# Patient Record
Sex: Female | Born: 1965 | Race: White | Hispanic: No | Marital: Married | Smoking: Never smoker
Health system: Southern US, Community
[De-identification: ages and names within clinical notes are randomized; demographics above are authoritative.]

## PROBLEM LIST (undated history)

## (undated) DIAGNOSIS — C801 Malignant (primary) neoplasm, unspecified: Secondary | ICD-10-CM

## (undated) DIAGNOSIS — R011 Cardiac murmur, unspecified: Secondary | ICD-10-CM

## (undated) DIAGNOSIS — C4490 Unspecified malignant neoplasm of skin, unspecified: Secondary | ICD-10-CM

## (undated) HISTORY — DX: Cardiac murmur, unspecified: R01.1

## (undated) HISTORY — PX: ABDOMINAL SURGERY: SHX537

## (undated) HISTORY — DX: Malignant (primary) neoplasm, unspecified: C80.1

## (undated) HISTORY — PX: COSMETIC SURGERY: SHX468

## (undated) HISTORY — DX: Unspecified malignant neoplasm of skin, unspecified: C44.90

## (undated) HISTORY — PX: ROUX-EN-Y GASTRIC BYPASS: SHX1104

## (undated) HISTORY — PX: BREAST SURGERY: SHX581

---

## 2005-01-26 ENCOUNTER — Ambulatory Visit: Payer: Self-pay | Admitting: Family Medicine

## 2005-02-08 ENCOUNTER — Ambulatory Visit: Payer: Self-pay | Admitting: Family Medicine

## 2005-02-15 ENCOUNTER — Ambulatory Visit: Payer: Self-pay | Admitting: Family Medicine

## 2005-02-16 ENCOUNTER — Ambulatory Visit: Payer: Self-pay | Admitting: Family Medicine

## 2005-02-17 ENCOUNTER — Ambulatory Visit: Payer: Self-pay | Admitting: Family Medicine

## 2005-03-10 ENCOUNTER — Ambulatory Visit: Payer: Self-pay | Admitting: Family Medicine

## 2005-04-10 ENCOUNTER — Ambulatory Visit: Payer: Self-pay | Admitting: Family Medicine

## 2005-05-11 ENCOUNTER — Ambulatory Visit: Payer: Self-pay | Admitting: Family Medicine

## 2012-02-19 ENCOUNTER — Other Ambulatory Visit: Payer: Self-pay | Admitting: Physician Assistant

## 2012-02-19 LAB — CBC WITH DIFFERENTIAL/PLATELET
Basophil #: 0.1 10*3/uL (ref 0.0–0.1)
Lymphocyte #: 1.6 10*3/uL (ref 1.0–3.6)
MCH: 25.3 pg — ABNORMAL LOW (ref 26.0–34.0)
MCHC: 32.4 g/dL (ref 32.0–36.0)
MCV: 78 fL — ABNORMAL LOW (ref 80–100)
Monocyte #: 0.6 x10 3/mm (ref 0.2–0.9)
Neutrophil %: 63.6 %
Platelet: 233 10*3/uL (ref 150–440)
RDW: 15.4 % — ABNORMAL HIGH (ref 11.5–14.5)

## 2012-10-25 ENCOUNTER — Ambulatory Visit: Payer: Self-pay | Admitting: General Practice

## 2013-08-18 ENCOUNTER — Ambulatory Visit: Payer: Self-pay | Admitting: General Practice

## 2016-08-16 DIAGNOSIS — Z85828 Personal history of other malignant neoplasm of skin: Secondary | ICD-10-CM | POA: Insufficient documentation

## 2018-03-31 ENCOUNTER — Emergency Department: Payer: Self-pay

## 2018-03-31 ENCOUNTER — Encounter: Payer: Self-pay | Admitting: *Deleted

## 2018-03-31 ENCOUNTER — Emergency Department
Admission: EM | Admit: 2018-03-31 | Discharge: 2018-03-31 | Disposition: A | Discharge status: Home / Self Care | Payer: Self-pay | Attending: Student in an Organized Health Care Education/Training Program | Admitting: Student in an Organized Health Care Education/Training Program

## 2018-03-31 ENCOUNTER — Other Ambulatory Visit: Payer: Self-pay

## 2018-03-31 DIAGNOSIS — G5602 Carpal tunnel syndrome, left upper limb: Secondary | ICD-10-CM | POA: Insufficient documentation

## 2018-03-31 DIAGNOSIS — Z79899 Other long term (current) drug therapy: Secondary | ICD-10-CM | POA: Insufficient documentation

## 2018-03-31 MED ORDER — TRIAMCINOLONE ACETONIDE 40 MG/ML IJ SUSP
10.0000 mg | Freq: Once | INTRAMUSCULAR | Status: AC
  Administered 2018-03-31: 10 mg via INTRA_ARTICULAR
  Filled 2018-03-31: qty 0.25

## 2018-03-31 MED ORDER — NAPROXEN 500 MG PO TABS
500.0000 mg | ORAL_TABLET | Freq: Once | ORAL | Status: AC
  Administered 2018-03-31: 500 mg via ORAL
  Filled 2018-03-31: qty 1

## 2018-03-31 MED ORDER — BUPIVACAINE HCL (PF) 0.5 % IJ SOLN
30.0000 mL | Freq: Once | INTRAMUSCULAR | Status: DC
  Filled 2018-03-31: qty 30

## 2018-03-31 MED ORDER — PREDNISONE 20 MG PO TABS
60.0000 mg | ORAL_TABLET | Freq: Once | ORAL | Status: AC
  Administered 2018-03-31: 60 mg via ORAL
  Filled 2018-03-31: qty 3

## 2018-03-31 MED ORDER — LIDOCAINE HCL (PF) 1 % IJ SOLN
5.0000 mL | Freq: Once | INTRAMUSCULAR | Status: AC
  Administered 2018-03-31: 5 mL via INTRADERMAL
  Filled 2018-03-31: qty 5

## 2018-03-31 MED ORDER — NAPROXEN 500 MG PO TABS: 500.0000 mg | ORAL_TABLET | Freq: Two times a day (BID) | ORAL | 0 refills | Status: AC

## 2018-03-31 MED ORDER — HYDROCODONE-ACETAMINOPHEN 5-325 MG PO TABS: 1.0000 | ORAL_TABLET | ORAL | 0 refills | Status: AC | PRN

## 2018-03-31 NOTE — ED Notes (Signed)
Patient c/o burning/numbness/sharp pain and weakness in left hand beginning Thursday. Patient went to urgent care Friday for same, dx with gout, prescribed colchicine - had little to no relief of symptoms.

## 2018-03-31 NOTE — ED Triage Notes (Signed)
Pt has weaker L grip and her L hand is cooler to the touch.

## 2018-03-31 NOTE — ED Provider Notes (Signed)
[facility] Emergency Department Provider Note    First MD Initiated Contact with Patient 03/31/18 [phone]     (approximate)  I have reviewed the triage vital signs and the nursing notes.   HISTORY  Chief Complaint Hand Pain    HPI Angela Contreras is a 52 y.o. female presents the ER for evaluation of several days of pins and needle sensation to the left hand.  Denies any injury.  Works at [employer].  Was recently seen by urgent care and was told that it was gout and placed on colchicine however the patient is not having any improvement in symptoms.  States that she has trouble feeling her fingers and that they feel cold.  States it is worse on the volar aspect of her hand.  She is right-hand dominant.  Denies any neck pain chest pain or upper arm pain.    History reviewed. No pertinent past medical history. History reviewed. No pertinent family history. Past Surgical History:  Procedure Laterality Date  . ABDOMINAL SURGERY    . ROUX-EN-Y GASTRIC BYPASS     There are no active problems to display for this patient.     Prior to Admission medications   Medication Sig Start Date End Date Taking? Authorizing Provider  ferrous sulfate 325 (65 FE) MG EC tablet Take 325 mg by mouth 3 (three) times daily with meals.   Yes [provider]  MULTIPLE VITAMIN PO Take by mouth.   Yes [provider]  vitamin B-12 (CYANOCOBALAMIN) 100 MCG tablet Take 100 mcg by mouth daily.   Yes [provider]  HYDROcodone-acetaminophen (NORCO) 5-325 MG tablet Take 1 tablet by mouth every 4 (four) hours as needed for moderate pain. 03/31/18   Merlyn Lot, MD  naproxen (NAPROSYN) 500 MG tablet Take 1 tablet (500 mg total) by mouth 2 (two) times daily with a meal. 03/31/18 03/31/19  Merlyn Lot, MD    Allergies Patient has no known allergies.    Social History Social History   Tobacco Use  . Smoking status: Never Smoker  . Smokeless  tobacco: Never Used  Substance Use Topics  . Alcohol use: Not Currently  . Drug use: Never    Review of Systems Patient denies headaches, rhinorrhea, blurry vision, numbness, shortness of breath, chest pain, edema, cough, abdominal pain, nausea, vomiting, diarrhea, dysuria, fevers, rashes or hallucinations unless otherwise stated above in HPI. ____________________________________________   PHYSICAL EXAM:  VITAL SIGNS: Vitals:   03/31/18 0230 03/31/18 0615  BP: (!) 155/90 (!) 155/90  Pulse: 84 84  Resp: 17 20  Temp: 98.2 F (36.8 C)   SpO2: 100% 100%    Constitutional: Alert and oriented.  Eyes: Conjunctivae are normal.  Head: Atraumatic. Nose: No congestion/rhinnorhea. Mouth/Throat: Mucous membranes are moist.   Neck: No stridor. Painless ROM.  Cardiovascular: Normal rate, regular rhythm. Grossly normal heart sounds.  Good peripheral circulation. Respiratory: Normal respiratory effort.  No retractions. Lungs CTAB. Gastrointestinal: Soft and nontender. No distention. No abdominal bruits. No CVA tenderness. Genitourinary:  Musculoskeletal: Brisk cap refill.  Strong radial and ulnar pulse.  Able to cross fingers make okay sign and touch thumb to pinky bilateral upper extremities.  Right hand is slightly warmer to touch as compared to the left.  no lower extremity tenderness nor edema.  No joint effusions. Neurologic:  Normal speech and language. No gross focal neurologic deficits are appreciated. No facial droop Skin:  Skin is warm, dry and intact. No rash noted. Psychiatric:  Mood and affect are normal. Speech and behavior are normal.  ____________________________________________   LABS (all labs ordered are listed, but only abnormal results are displayed)  No results found for this or any previous visit (from the past 24 hour(s)). ____________________________________________  EKG My review and personal interpretation at Time: 4:32   Indication: wrist pain  Rate: 75   Rhythm: sinus Axis: normal Other: normal intervals, no stemi ____________________________________________  RADIOLOGY  I personally reviewed all radiographic images ordered to evaluate for the above acute complaints and reviewed radiology reports and findings.  These findings were personally discussed with the patient.  Please see medical record for radiology report.  ____________________________________________   PROCEDURES  Procedure(s) performed:  Procedures Procedure: carpal tunnel steroid injection The patient agreed to the procedure without reservation, and acknowledging the risks of infection, nerve damage, damage to ligaments and tendons,bleeding. The left volar wrist was prepped with chloraprep and allowed to dry. A 5 cc syringe was loaded with 1% lidocaine and 20mg  of kenalog and a 25 ga needle advanced toward thecarpal tunnel using anatomic land marks and [place]. The plunger was withdrawn before injection. The patient tolerated the procedure well.    Critical Care performed: no ____________________________________________   INITIAL IMPRESSION / ASSESSMENT AND PLAN / ED COURSE  Pertinent labs & imaging results that were available during my care of the patient were reviewed by me and considered in my medical decision making (see chart for details).   DDX: Carpal tunnel, peripheral neuropathy, ischemia  Angela Contreras is a 52 y.o. who presents to the ED with pain of left hand as described above.  Seems clinically consistent with carpal tunnel.  Has good peripheral circulation and pulses.  Not consistent with ischemic limb.  No abnormality on exam more proximally to the wrist.  Positive Tinel's and Phalen sign.  Will check x-ray to exclude arthritic or osteolytic changes.  Clinical Course as of Mar 31 722  Sun Mar 31, 2018  3614 After discussing risks versus benefits and consenting patient to the procedure a injection of triamcinolone with lidocaine was performed into the  carpal tunnel after extensive skin preparation with chlorhexidine and using sterile technique using ultrasound guidance with improvement in symptoms.   [PR]    Clinical Course User Index [PR] Merlyn Lot, MD     As part of my medical decision making, I reviewed the following data within the [facility] notes reviewed and incorporated, Labs reviewed, notes from prior ED visits.   ____________________________________________   FINAL CLINICAL IMPRESSION(S) / ED DIAGNOSES  Final diagnoses:  Carpal tunnel syndrome of left wrist      NEW MEDICATIONS STARTED DURING THIS VISIT:  Discharge Medication List as of 03/31/2018  6:17 AM    START taking these medications   Details  HYDROcodone-acetaminophen (NORCO) 5-325 MG tablet Take 1 tablet by mouth every 4 (four) hours as needed for moderate pain., Starting Sun 03/31/2018, Print    naproxen (NAPROSYN) 500 MG tablet Take 1 tablet (500 mg total) by mouth 2 (two) times daily with a meal., Starting Sun 03/31/2018, Until Mon 03/31/2019, Print         Note:  This document was prepared using Dragon voice recognition software and may include unintentional dictation errors.    Merlyn Lot, MD 03/31/18 [phone]

## 2018-03-31 NOTE — ED Triage Notes (Addendum)
Pt c/o pins and needles sensation to left hand; says she was seen by her provider this past week for a different reason but brought her hand pain up in discussion; was told it was probably gout and put on Colchicine; pt says now she can hardly feel her fingers; her fingers feel cold; pain to the back of her hand; denies fall/injury; no swelling observed; strong radial pulse noted

## 2018-04-17 ENCOUNTER — Encounter: Payer: Self-pay | Admitting: Family Medicine

## 2018-04-17 ENCOUNTER — Ambulatory Visit (INDEPENDENT_AMBULATORY_CARE_PROVIDER_SITE_OTHER): Payer: BLUE CROSS/BLUE SHIELD | Admitting: Family Medicine

## 2018-04-17 VITALS — BP 135/84 | HR 82 | Temp 97.9°F | Ht 62.0 in | Wt 195.0 lb

## 2018-04-17 DIAGNOSIS — M2550 Pain in unspecified joint: Secondary | ICD-10-CM

## 2018-04-17 DIAGNOSIS — Z7689 Persons encountering health services in other specified circumstances: Secondary | ICD-10-CM

## 2018-04-17 DIAGNOSIS — M0579 Rheumatoid arthritis with rheumatoid factor of multiple sites without organ or systems involvement: Secondary | ICD-10-CM | POA: Diagnosis not present

## 2018-04-17 MED ORDER — PREDNISONE 20 MG PO TABS: 40.0000 mg | ORAL_TABLET | Freq: Every day | ORAL | 0 refills | Status: AC

## 2018-04-17 NOTE — Progress Notes (Signed)
BP 135/84   Pulse 82   Temp 97.9 F (36.6 C) (Oral)   Ht 5' 2"  (1.575 m)   Wt 195 lb (88.5 kg)   SpO2 95%   BMI 35.67 kg/m    Subjective:    Patient ID: Angela Contreras, female    DOB: 29-Jan-1966, 53 y.o.   MRN: [id number]  HPI: Angela Contreras is a 53 y.o. female  Chief Complaint  Patient presents with  . New Patient (Initial Visit)    Pt believes she has rhematoid artritis, was previously told by Dr. Jefm Bryant she did have the R.A. factor, but they would just watch it  . Referral    To rheumatology  . Knee Injury    Left knee, stepped down wrong right before Christmas. Heard a "pop". Started swelling. Seen at [facility], told she had a torn ligament and given a brace  . Wrist Pain    Noticed wrist pain started after knee injury. UC said it was gout, medication did not help. Went to E.R. was told she had carpal tunnel  . Shoulder Pain    Began after the wrist pain. All pain began after a few days of each other   Here today to establish care.   Started seeing Dr. Jefm Bryant for positive RF 6 years ago and was asymptomatic since until last month. Started last month with left knee pain and sweilling. Then left wrist started to hurt and swell. Went to UC and was given a knee brace and told her wrist looked like gout. Was given colchicine which may have helped some for 24 hours but got much worse after that. Felt like her hand was being squeezed off so went to ER. Dx'd with carpal tunnel. Was referred to orthopedics and given anti inflammatories. Wearing a night brace. Much improved but still weak.   Then had right ankle pain pop up and right shoulder started acting up.  Then started having right rib pain that has since dissipated.  Now having neck stiffness and pain when she sits up first thing in the morning. This dissipates as she gets up and moving.   Had gastric bypass in 2007. Not regularly followed by GI.   Otherwise, no known medical issues.   Has not had a CPE in years.    Relevant past medical, surgical, family and social history reviewed and updated as indicated. Interim medical history since our last visit reviewed. Allergies and medications reviewed and updated.  Review of Systems  Per HPI unless specifically indicated above     Objective:    BP 135/84   Pulse 82   Temp 97.9 F (36.6 C) (Oral)   Ht 5' 2"  (1.575 m)   Wt 195 lb (88.5 kg)   SpO2 95%   BMI 35.67 kg/m   Wt Readings from Last 3 Encounters:  04/17/18 195 lb (88.5 kg)  03/31/18 205 lb (93 kg)    Physical Exam Vitals signs and nursing note reviewed.  Constitutional:      Appearance: Normal appearance. She is not ill-appearing.  HENT:     Head: Atraumatic.  Eyes:     Extraocular Movements: Extraocular movements intact.     Conjunctiva/sclera: Conjunctivae normal.  Neck:     Musculoskeletal: Normal range of motion and neck supple.  Cardiovascular:     Rate and Rhythm: Normal rate and regular rhythm.     Heart sounds: Normal heart sounds.  Pulmonary:     Effort: Pulmonary effort is normal.  Breath sounds: Normal breath sounds.  Musculoskeletal: Normal range of motion.        General: Swelling (multiple joints, minimal) present.  Skin:    General: Skin is warm and dry.  Neurological:     Mental Status: She is alert and oriented to person, place, and time.  Psychiatric:        Mood and Affect: Mood normal.        Thought Content: Thought content normal.        Judgment: Judgment normal.     Results for orders placed or performed in visit on 04/17/18  Rheumatoid Factor  Result Value Ref Range   Rhuematoid fact SerPl-aCnc 171.5 (H) 0.0 - 13.9 IU/mL  ANA w/Reflex  Result Value Ref Range   Anti Nuclear Antibody(ANA) Negative Negative  Sed Rate (ESR)  Result Value Ref Range   Sed Rate 98 (H) 0 - 40 mm/hr  CYCLIC CITRUL PEPTIDE ANTIBODY, IGG/IGA  Result Value Ref Range   Cyclic Citrullin Peptide Ab >250 (H) 0 - 19 units  C-reactive protein  Result Value Ref  Range   CRP 16 (H) 0 - 10 mg/L  CBC with Differential/Platelet  Result Value Ref Range   WBC 12.2 (H) 3.4 - 10.8 x10E3/uL   RBC 4.73 3.77 - 5.28 x10E6/uL   Hemoglobin 13.2 11.1 - 15.9 g/dL   Hematocrit 40.3 34.0 - 46.6 %   MCV 85 79 - 97 fL   MCH 27.9 26.6 - 33.0 pg   MCHC 32.8 31.5 - 35.7 g/dL   RDW 11.9 11.7 - 15.4 %   Platelets 434 150 - 450 x10E3/uL   Neutrophils 68 Not Estab. %   Lymphs 16 Not Estab. %   Monocytes 6 Not Estab. %   Eos 9 Not Estab. %   Basos 1 Not Estab. %   Neutrophils Absolute 8.4 (H) 1.4 - 7.0 x10E3/uL   Lymphocytes Absolute 1.9 0.7 - 3.1 x10E3/uL   Monocytes Absolute 0.7 0.1 - 0.9 x10E3/uL   EOS (ABSOLUTE) 1.1 (H) 0.0 - 0.4 x10E3/uL   Basophils Absolute 0.1 0.0 - 0.2 x10E3/uL   Immature Granulocytes 0 Not Estab. %   Immature Grans (Abs) 0.0 0.0 - 0.1 x10E3/uL      Assessment & Plan:   Problem List Items Addressed This Visit      Musculoskeletal and Integument   Rheumatoid arthritis involving multiple sites with positive rheumatoid factor ([place]) - Primary    Suspect all of her recent joint complaints are related to an RA flare. Start prednisone burst and refer to Rheumatology for management. Pt requesting repeat lab workup as she states it was requested by the Rheumatology office she spoke with. Anti inflammatory diet reviewed as well      Relevant Medications   predniSONE (DELTASONE) 20 MG tablet   Other Relevant Orders   Ambulatory referral to Rheumatology   Rheumatoid Factor (Completed)   ANA w/Reflex (Completed)   Sed Rate (ESR) (Completed)   CYCLIC CITRUL PEPTIDE ANTIBODY, IGG/IGA (Completed)   C-reactive protein (Completed)   CBC with Differential/Platelet (Completed)    Other Visit Diagnoses    Encounter to establish care       Arthralgia, unspecified joint       Relevant Orders   Ambulatory referral to Rheumatology   Rheumatoid Factor (Completed)   ANA w/Reflex (Completed)   Sed Rate (ESR) (Completed)   CYCLIC CITRUL PEPTIDE  ANTIBODY, IGG/IGA (Completed)   C-reactive protein (Completed)   CBC with Differential/Platelet (Completed)  Follow up plan: Return for CPE.

## 2018-04-19 LAB — CBC WITH DIFFERENTIAL/PLATELET
BASOS: 1 %
Basophils Absolute: 0.1 10*3/uL (ref 0.0–0.2)
EOS (ABSOLUTE): 1.1 10*3/uL — ABNORMAL HIGH (ref 0.0–0.4)
Eos: 9 %
HEMATOCRIT: 40.3 % (ref 34.0–46.6)
Hemoglobin: 13.2 g/dL (ref 11.1–15.9)
Immature Grans (Abs): 0 10*3/uL (ref 0.0–0.1)
Immature Granulocytes: 0 %
LYMPHS ABS: 1.9 10*3/uL (ref 0.7–3.1)
Lymphs: 16 %
MCH: 27.9 pg (ref 26.6–33.0)
MCHC: 32.8 g/dL (ref 31.5–35.7)
MCV: 85 fL (ref 79–97)
Monocytes Absolute: 0.7 10*3/uL (ref 0.1–0.9)
Monocytes: 6 %
Neutrophils Absolute: 8.4 10*3/uL — ABNORMAL HIGH (ref 1.4–7.0)
Neutrophils: 68 %
Platelets: 434 10*3/uL (ref 150–450)
RBC: 4.73 x10E6/uL (ref 3.77–5.28)
RDW: 11.9 % (ref 11.7–15.4)
WBC: 12.2 10*3/uL — ABNORMAL HIGH (ref 3.4–10.8)

## 2018-04-19 LAB — C-REACTIVE PROTEIN: CRP: 16 mg/L — ABNORMAL HIGH (ref 0–10)

## 2018-04-19 LAB — ANA W/REFLEX: ANA: NEGATIVE

## 2018-04-19 LAB — CYCLIC CITRUL PEPTIDE ANTIBODY, IGG/IGA: Cyclic Citrullin Peptide Ab: >250 units — ABNORMAL HIGH (ref 0–19)

## 2018-04-19 LAB — SEDIMENTATION RATE: Sed Rate: 98 mm/hr — ABNORMAL HIGH (ref 0–40)

## 2018-04-19 LAB — RHEUMATOID FACTOR: Rheumatoid fact SerPl-aCnc: 171.5 IU/mL — ABNORMAL HIGH (ref 0.0–13.9)

## 2018-04-20 DIAGNOSIS — M0579 Rheumatoid arthritis with rheumatoid factor of multiple sites without organ or systems involvement: Secondary | ICD-10-CM | POA: Insufficient documentation

## 2018-04-20 NOTE — Assessment & Plan Note (Signed)
Suspect all of her recent joint complaints are related to an RA flare. Start prednisone burst and refer to Rheumatology for management. Pt requesting repeat lab workup as she states it was requested by the Rheumatology office she spoke with. Anti inflammatory diet reviewed as well

## 2019-05-28 ENCOUNTER — Other Ambulatory Visit: Payer: Self-pay | Admitting: Physician Assistant

## 2019-05-28 ENCOUNTER — Other Ambulatory Visit: Payer: Self-pay

## 2019-05-28 ENCOUNTER — Ambulatory Visit
Admission: RE | Admit: 2019-05-28 | Discharge: 2019-05-28 | Disposition: A | Discharge status: Home / Self Care | Payer: BLUE CROSS/BLUE SHIELD | Source: Ambulatory Visit | Attending: Physician Assistant | Admitting: Physician Assistant

## 2019-05-28 DIAGNOSIS — R2242 Localized swelling, mass and lump, left lower limb: Secondary | ICD-10-CM

## 2020-01-15 IMAGING — DX DG WRIST COMPLETE 3+V*L*
4 series · 4 of 4 positions shown · non-contrast
Comparison: None.

CLINICAL DATA: Sharp wrist pain, diagnosed with gout 5 days ago.

EXAM:
LEFT WRIST - COMPLETE 3+ VIEW

[wrist ap (1 of 2)]
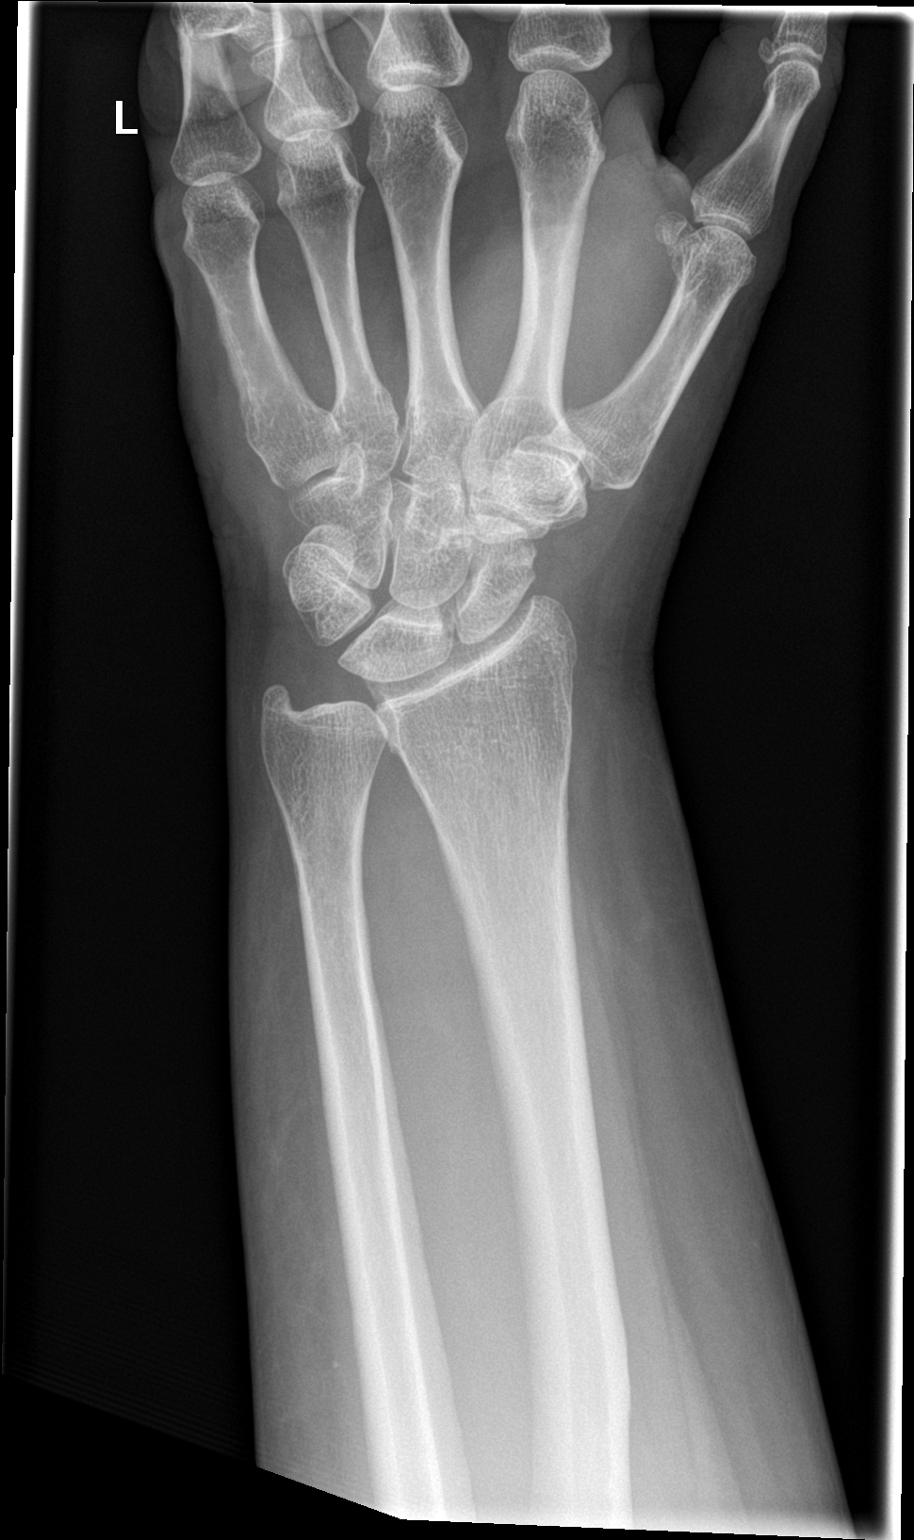

[wrist obl]
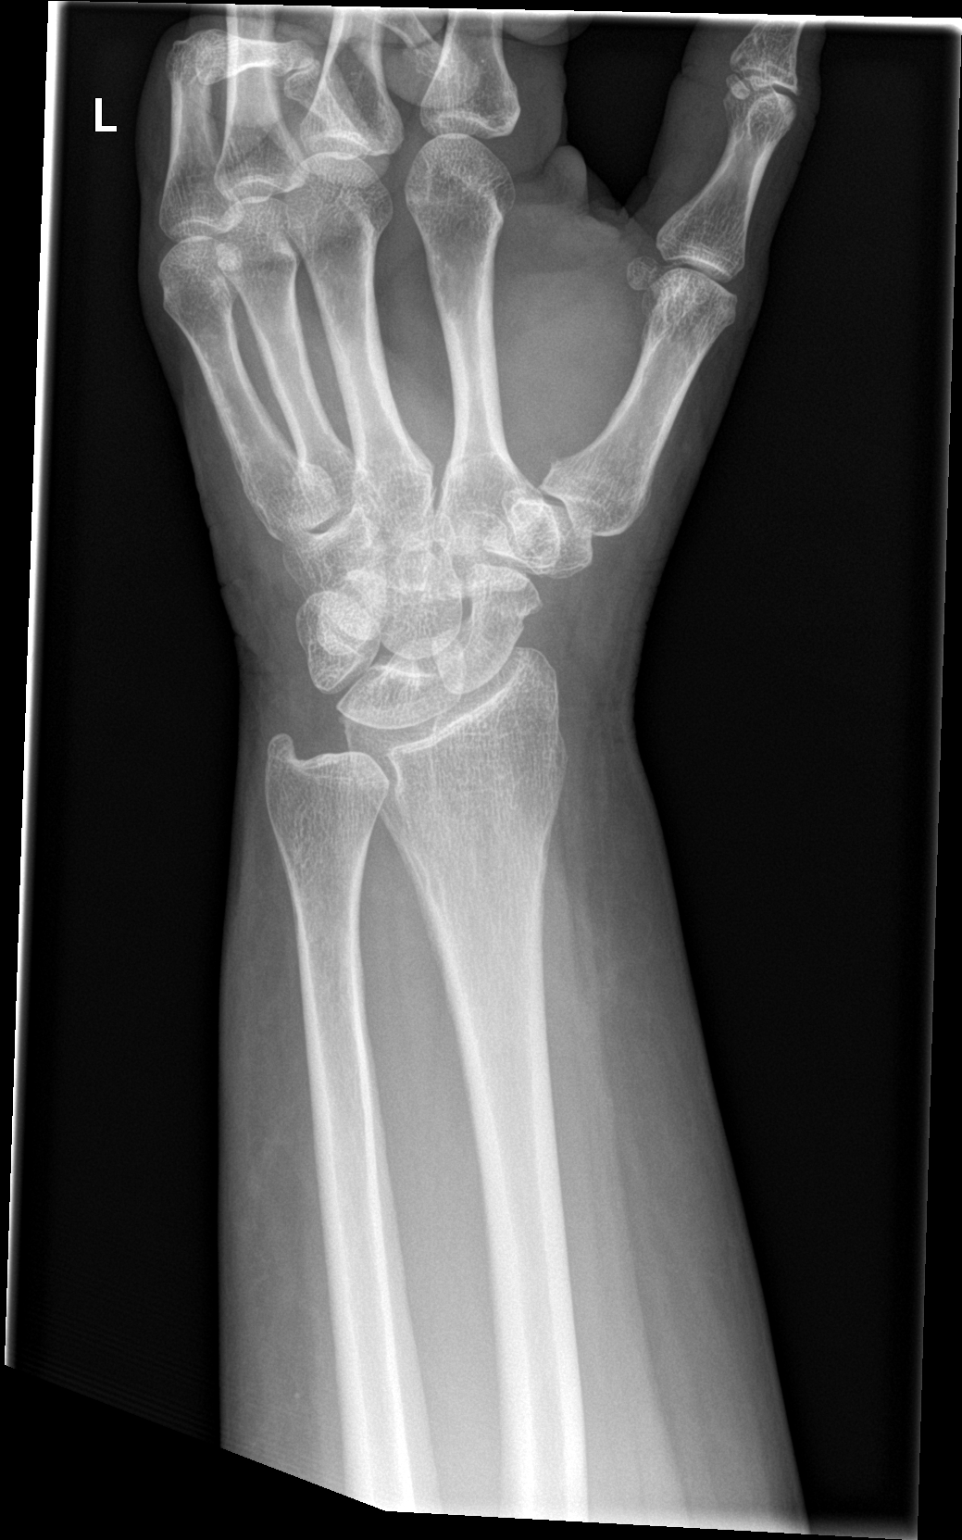

[wrist lat]
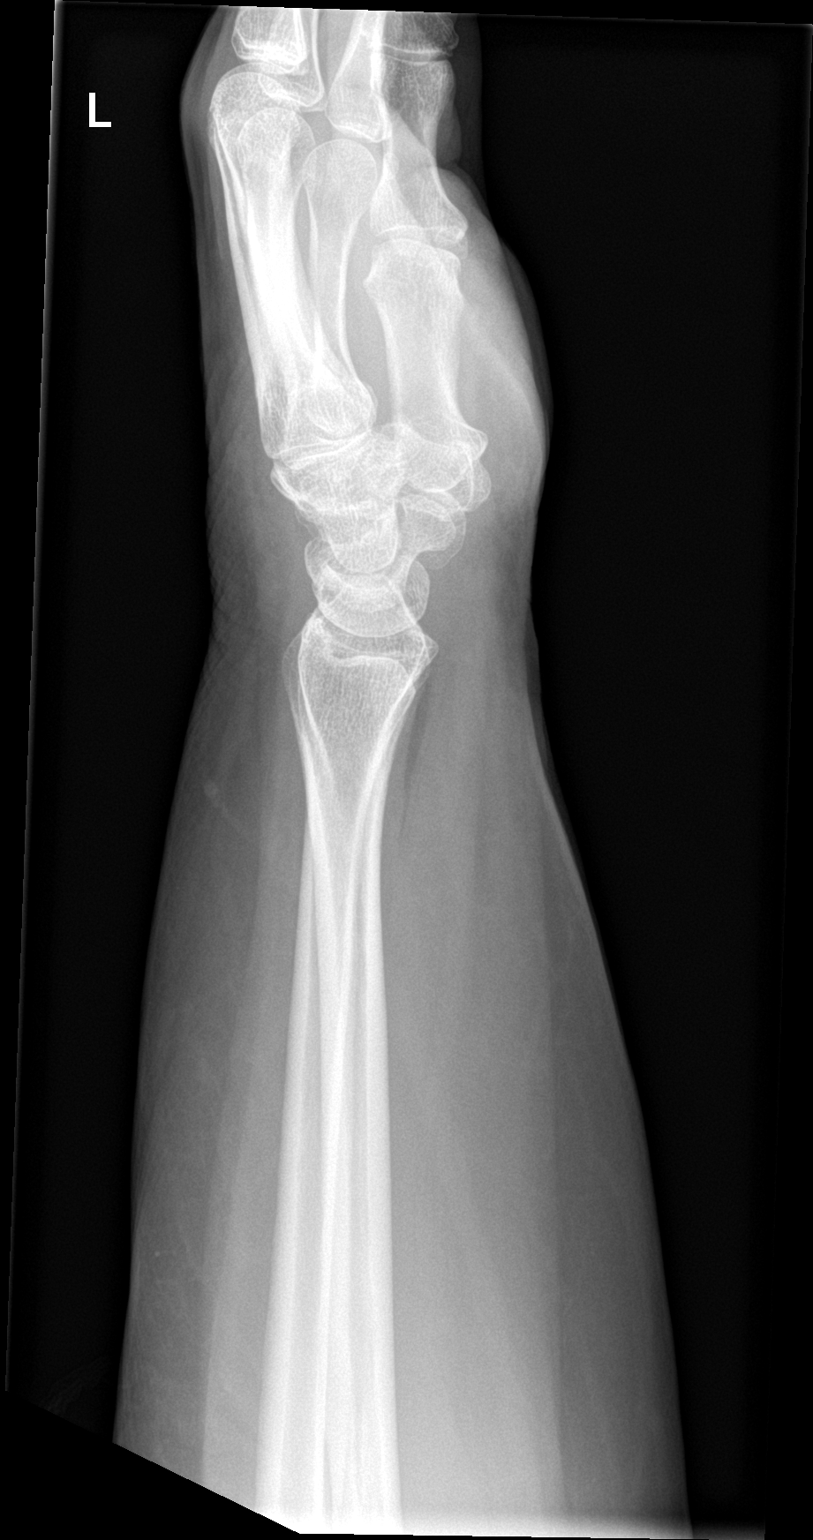

[wrist ap (2 of 2)]
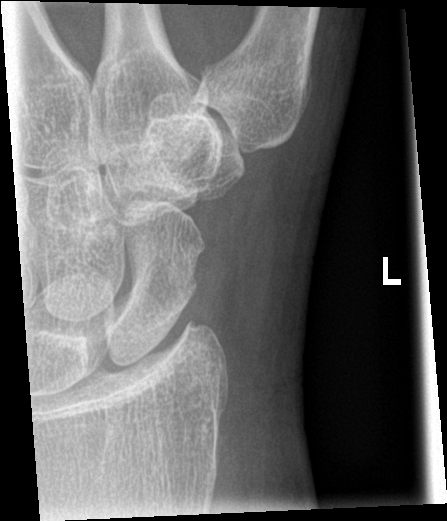

[4 of 4 positions shown; findings below may reference images not displayed]

FINDINGS: There is no evidence of fracture or dislocation. There is no
evidence of arthropathy or other focal bone abnormality. Soft
tissues are unremarkable.
IMPRESSION: Negative.

## 2021-03-13 IMAGING — US US EXTREM LOW VENOUS*L*
1 series · 14 of 24 positions shown · non-contrast
Comparison: None.

CLINICAL DATA: Left leg swelling

EXAM:
LEFT LOWER EXTREMITY VENOUS DOPPLER ULTRASOUND
TECHNIQUE: Gray-scale sonography with compression, as well as color and duplex
ultrasound, were performed to evaluate the deep venous system(s)
from the level of the common femoral vein through the popliteal and
proximal calf veins.

[Series 1: us extrem low venous*left* · 0.07mm/px · 45 acquisitions, 14 frames shown]
[im 1/45]
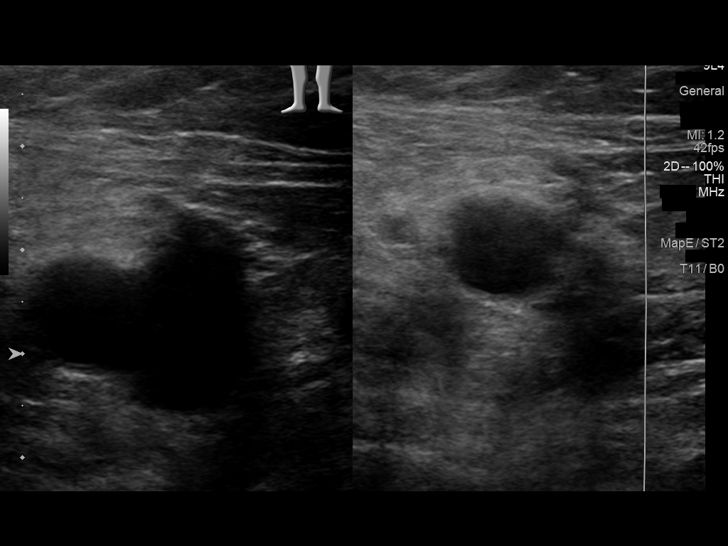
[im 4/45]
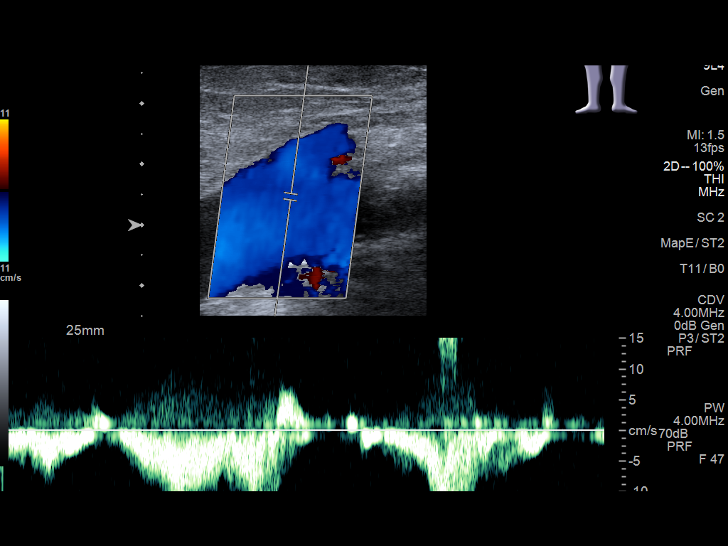
[im 8/45]
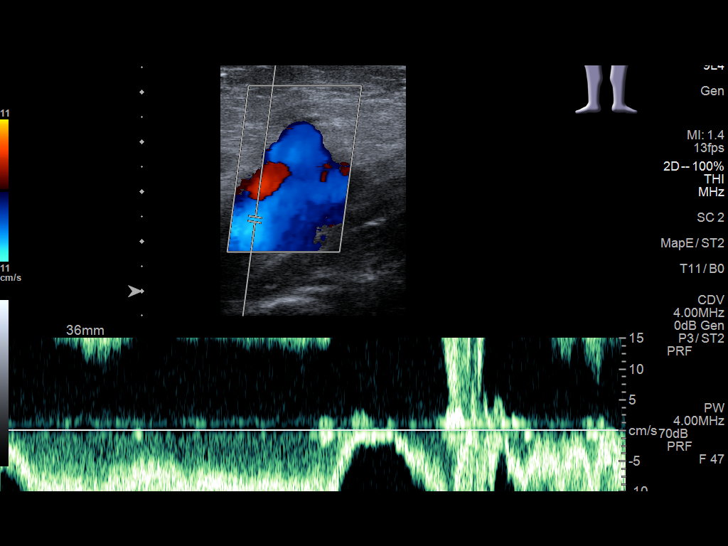
[im 12/45]
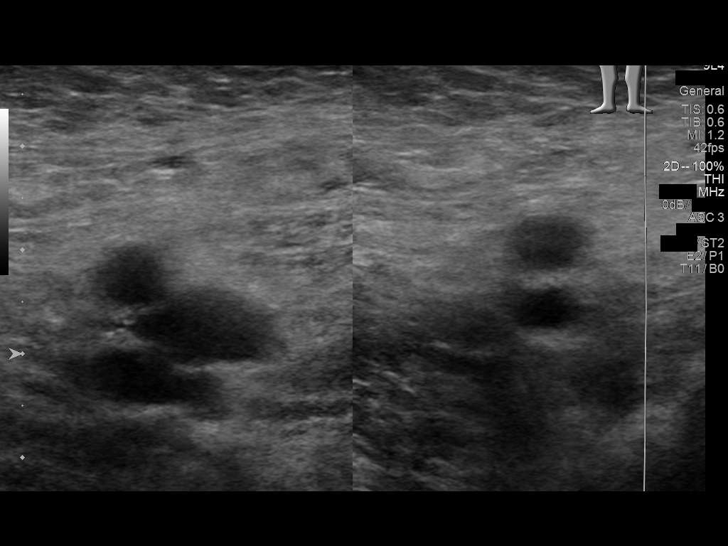
[im 14/45]
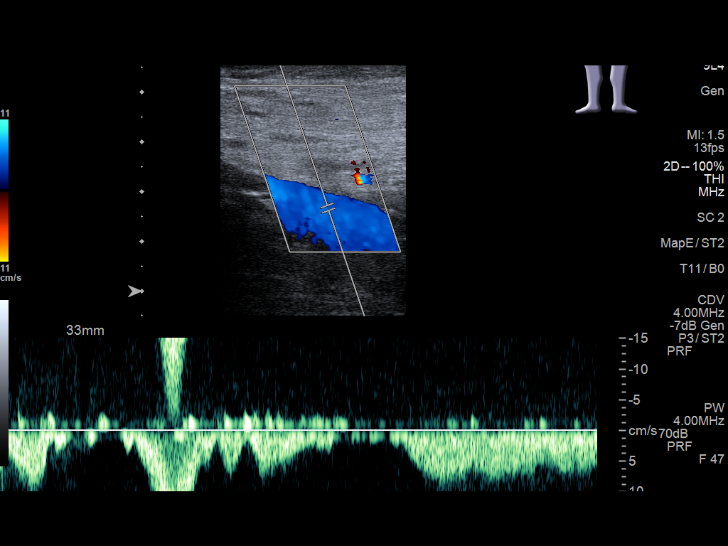
[im 18/45]
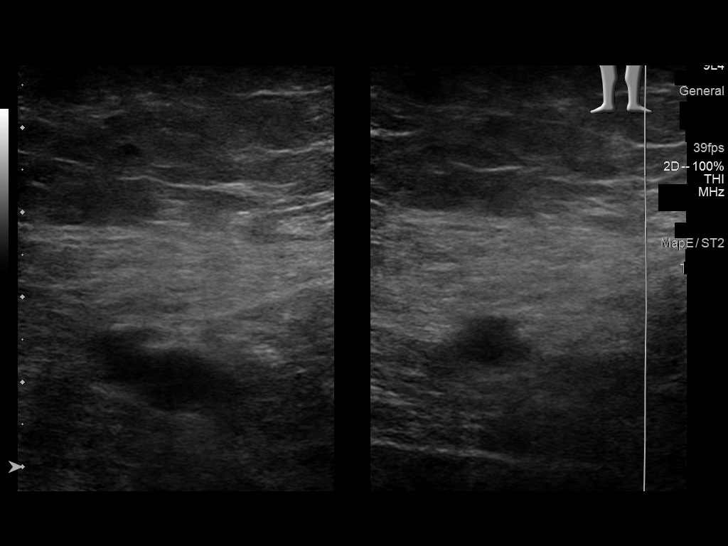
[im 22/45]
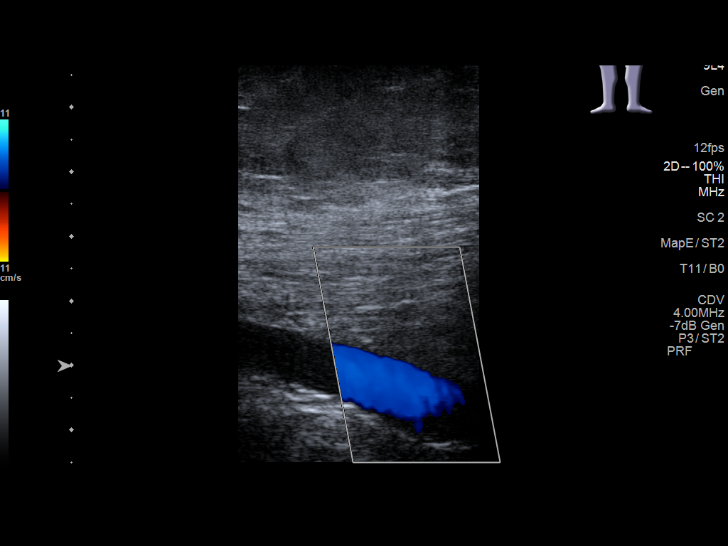
[im 25/45]
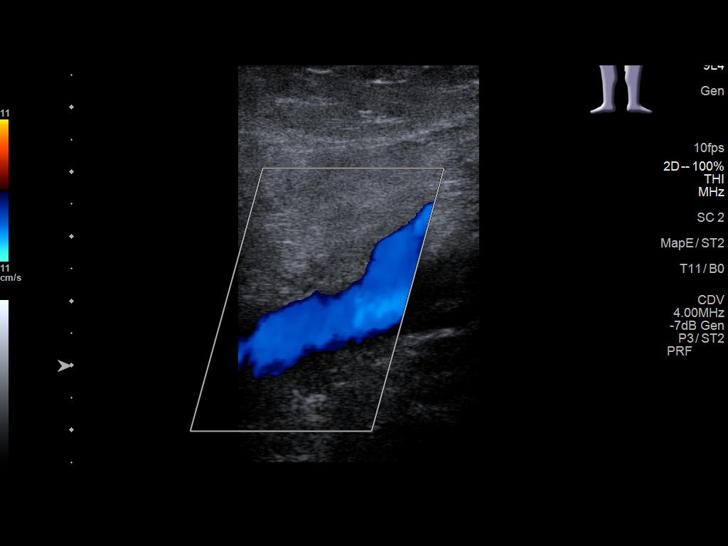
[im 29/45]
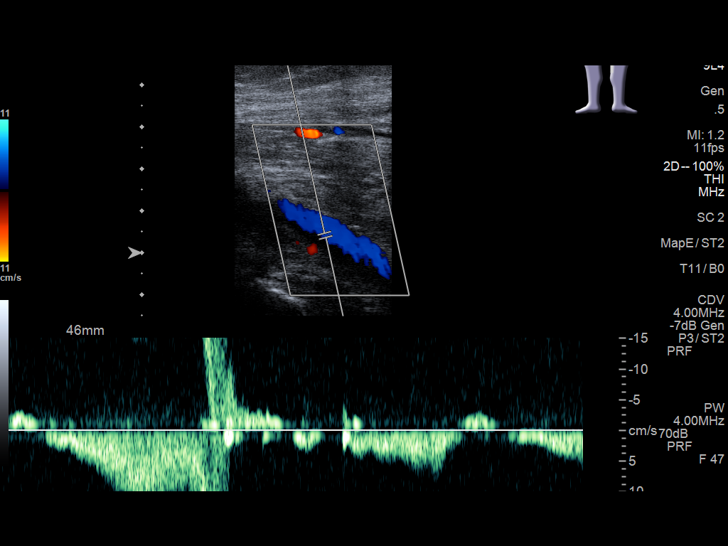
[im 33/45]
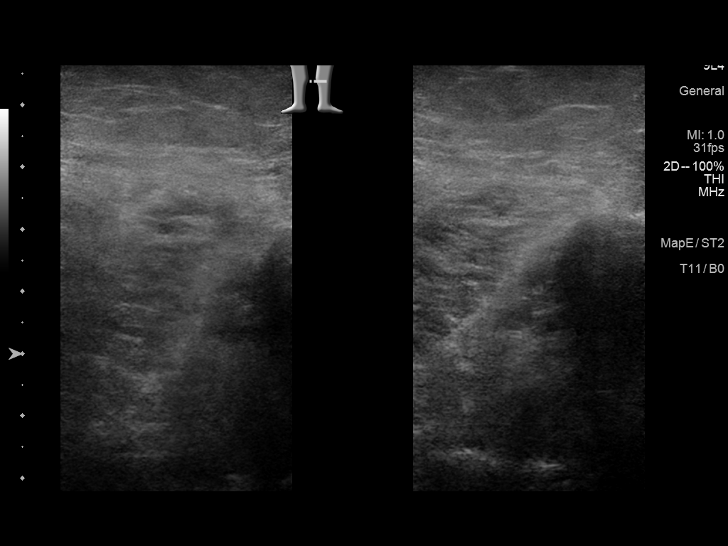
[im 37/45]
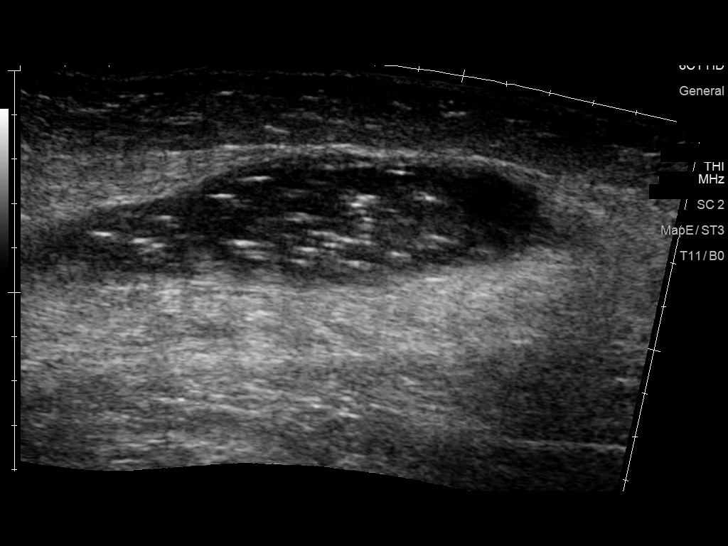
[im 39/45]
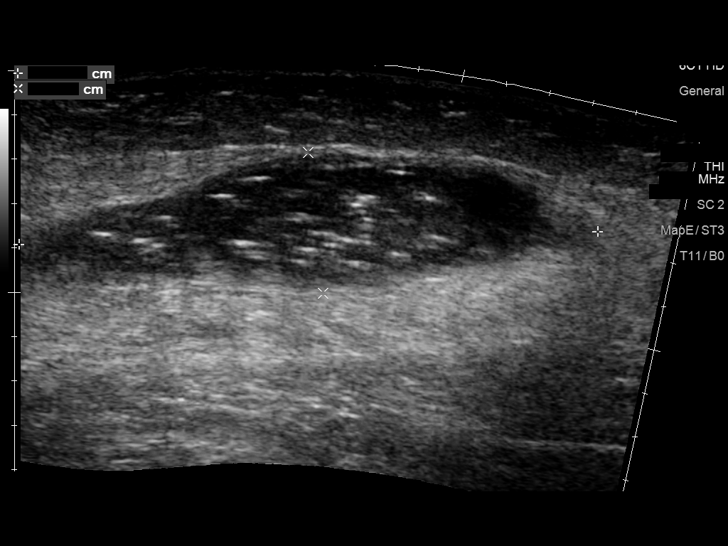
[im 43/45]
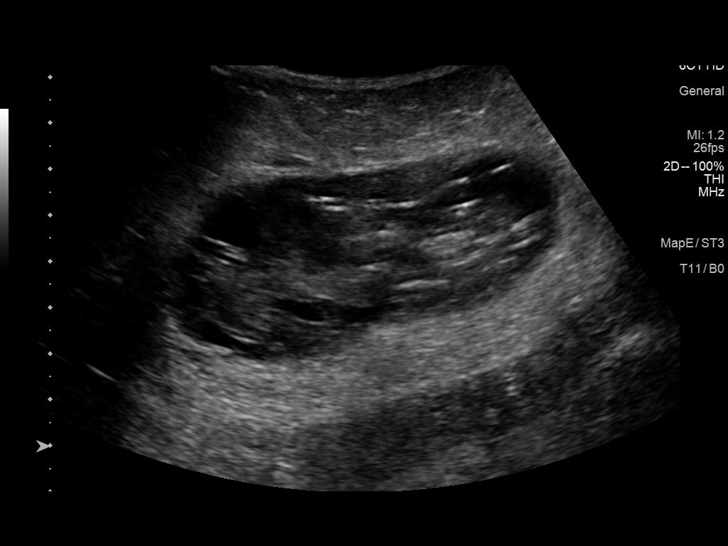
[im 45/45]
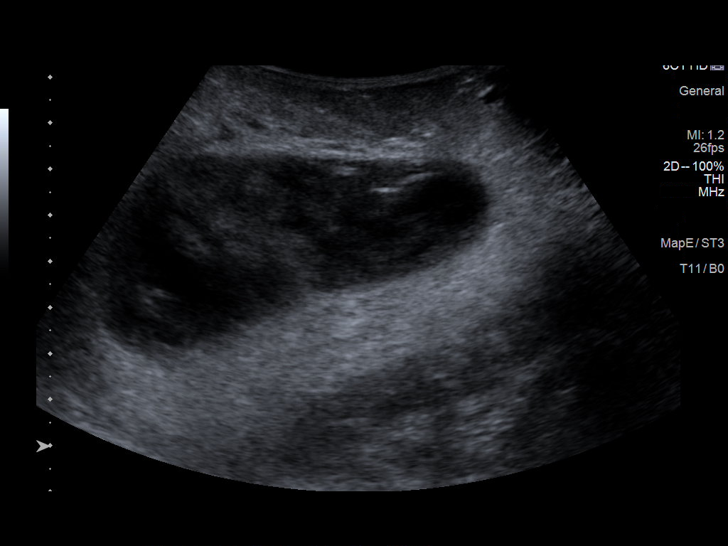

[14 of 24 positions shown; findings below may reference images not displayed]

FINDINGS: VENOUS

Normal compressibility of the common femoral, superficial femoral,
and popliteal veins, as well as the visualized calf veins.
Visualized portions of profunda femoral vein and great saphenous
vein unremarkable. No filling defects to suggest DVT on grayscale or
color Doppler imaging. Doppler waveforms show normal direction of
venous flow, normal respiratory phasicity and response to
augmentation.

Limited views of the contralateral common femoral vein are
unremarkable.

OTHER

Complex hypoechoic area noted in the medial left calf measuring 13 x
8.7 x 3.2 cm.

Limitations: none
IMPRESSION: No evidence of left lower extremity DVT.

Complex hypoechoic area in the medial left calf measuring up to 13
cm. This may reflect hematoma.
